# Patient Record
Sex: Female | Born: 1975 | Race: Black or African American | Hispanic: No | Smoking: Current every day smoker
Health system: Southern US, Community
[De-identification: ages and names within clinical notes are randomized; demographics above are authoritative.]

## PROBLEM LIST (undated history)

## (undated) DIAGNOSIS — F1410 Cocaine abuse, uncomplicated: Secondary | ICD-10-CM

## (undated) DIAGNOSIS — N739 Female pelvic inflammatory disease, unspecified: Secondary | ICD-10-CM

## (undated) DIAGNOSIS — C50919 Malignant neoplasm of unspecified site of unspecified female breast: Secondary | ICD-10-CM

## (undated) DIAGNOSIS — F319 Bipolar disorder, unspecified: Secondary | ICD-10-CM

## (undated) DIAGNOSIS — J45909 Unspecified asthma, uncomplicated: Secondary | ICD-10-CM

## (undated) DIAGNOSIS — F1910 Other psychoactive substance abuse, uncomplicated: Secondary | ICD-10-CM

## (undated) DIAGNOSIS — M25519 Pain in unspecified shoulder: Secondary | ICD-10-CM

## (undated) DIAGNOSIS — M25559 Pain in unspecified hip: Secondary | ICD-10-CM

## (undated) DIAGNOSIS — N200 Calculus of kidney: Secondary | ICD-10-CM

## (undated) DIAGNOSIS — I890 Lymphedema, not elsewhere classified: Secondary | ICD-10-CM

## (undated) DIAGNOSIS — F39 Unspecified mood [affective] disorder: Secondary | ICD-10-CM

---

## 2013-01-07 ENCOUNTER — Emergency Department: Payer: Self-pay | Admitting: Emergency Medicine

## 2013-01-09 ENCOUNTER — Emergency Department: Payer: Self-pay | Admitting: Emergency Medicine

## 2019-04-01 ENCOUNTER — Encounter: Payer: Self-pay | Admitting: Emergency Medicine

## 2019-04-01 ENCOUNTER — Other Ambulatory Visit: Payer: Self-pay

## 2019-04-01 ENCOUNTER — Emergency Department: Payer: Medicaid Other

## 2019-04-01 ENCOUNTER — Emergency Department
Admission: EM | Admit: 2019-04-01 | Discharge: 2019-04-02 | Disposition: A | Discharge status: Home / Self Care | Payer: Medicaid Other | Attending: Student | Admitting: Student

## 2019-04-01 DIAGNOSIS — F17200 Nicotine dependence, unspecified, uncomplicated: Secondary | ICD-10-CM | POA: Insufficient documentation

## 2019-04-01 DIAGNOSIS — R410 Disorientation, unspecified: Secondary | ICD-10-CM | POA: Insufficient documentation

## 2019-04-01 DIAGNOSIS — Z79899 Other long term (current) drug therapy: Secondary | ICD-10-CM | POA: Insufficient documentation

## 2019-04-01 DIAGNOSIS — F1910 Other psychoactive substance abuse, uncomplicated: Secondary | ICD-10-CM | POA: Insufficient documentation

## 2019-04-01 DIAGNOSIS — J45909 Unspecified asthma, uncomplicated: Secondary | ICD-10-CM | POA: Insufficient documentation

## 2019-04-01 HISTORY — DX: Malignant neoplasm of unspecified site of unspecified female breast: C50.919

## 2019-04-01 HISTORY — DX: Unspecified mood (affective) disorder: F39

## 2019-04-01 HISTORY — DX: Pain in unspecified hip: M25.559

## 2019-04-01 HISTORY — DX: Pain in unspecified shoulder: M25.519

## 2019-04-01 HISTORY — DX: Unspecified asthma, uncomplicated: J45.909

## 2019-04-01 HISTORY — DX: Female pelvic inflammatory disease, unspecified: N73.9

## 2019-04-01 HISTORY — DX: Lymphedema, not elsewhere classified: I89.0

## 2019-04-01 HISTORY — DX: Other psychoactive substance abuse, uncomplicated: F19.10

## 2019-04-01 HISTORY — DX: Cocaine abuse, uncomplicated: F14.10

## 2019-04-01 HISTORY — DX: Calculus of kidney: N20.0

## 2019-04-01 HISTORY — DX: Bipolar disorder, unspecified: F31.9

## 2019-04-01 LAB — COMPREHENSIVE METABOLIC PANEL
ALT: 11 U/L (ref 0–44)
AST: 22 U/L (ref 15–41)
Albumin: 4.3 g/dL (ref 3.5–5.0)
Alkaline Phosphatase: 47 U/L (ref 38–126)
Anion gap: 9 (ref 5–15)
BUN: 23 mg/dL — ABNORMAL HIGH (ref 6–20)
CO2: 23 mmol/L (ref 22–32)
Calcium: 8.7 mg/dL — ABNORMAL LOW (ref 8.9–10.3)
Chloride: 108 mmol/L (ref 98–111)
Creatinine, Ser: 0.57 mg/dL (ref 0.44–1.00)
GFR calc Af Amer: >60 mL/min (ref >60)
GFR calc non Af Amer: >60 mL/min (ref >60)
Glucose, Bld: 98 mg/dL (ref 70–99)
Potassium: 3.6 mmol/L (ref 3.5–5.1)
Sodium: 140 mmol/L (ref 135–145)
Total Bilirubin: 0.5 mg/dL (ref 0.3–1.2)
Total Protein: 7.6 g/dL (ref 6.5–8.1)

## 2019-04-01 LAB — URINALYSIS, COMPLETE (UACMP) WITH MICROSCOPIC
Bacteria, UA: NONE SEEN
Bilirubin Urine: NEGATIVE
Glucose, UA: NEGATIVE mg/dL
Hgb urine dipstick: NEGATIVE
Ketones, ur: NEGATIVE mg/dL
Leukocytes,Ua: NEGATIVE
Nitrite: NEGATIVE
Protein, ur: NEGATIVE mg/dL
Specific Gravity, Urine: 1.024 (ref 1.005–1.030)
pH: 6 (ref 5.0–8.0)

## 2019-04-01 LAB — CBC WITH DIFFERENTIAL/PLATELET
Abs Immature Granulocytes: 0.04 10*3/uL (ref 0.00–0.07)
Basophils Absolute: 0.1 10*3/uL (ref 0.0–0.1)
Basophils Relative: 1 %
Eosinophils Absolute: 0.2 10*3/uL (ref 0.0–0.5)
Eosinophils Relative: 2 %
HCT: 36.9 % (ref 36.0–46.0)
Hemoglobin: 12 g/dL (ref 12.0–15.0)
Immature Granulocytes: 0 %
Lymphocytes Relative: 18 %
Lymphs Abs: 2 10*3/uL (ref 0.7–4.0)
MCH: 32.6 pg (ref 26.0–34.0)
MCHC: 32.5 g/dL (ref 30.0–36.0)
MCV: 100.3 fL — ABNORMAL HIGH (ref 80.0–100.0)
Monocytes Absolute: 0.9 10*3/uL (ref 0.1–1.0)
Monocytes Relative: 8 %
Neutro Abs: 7.6 10*3/uL (ref 1.7–7.7)
Neutrophils Relative %: 71 %
Platelets: 239 10*3/uL (ref 150–400)
RBC: 3.68 MIL/uL — ABNORMAL LOW (ref 3.87–5.11)
RDW: 13.4 % (ref 11.5–15.5)
WBC: 10.8 10*3/uL — ABNORMAL HIGH (ref 4.0–10.5)
nRBC: 0 % (ref 0.0–0.2)

## 2019-04-01 LAB — BLOOD GAS, VENOUS
Acid-base deficit: 2.2 mmol/L — ABNORMAL HIGH (ref 0.0–2.0)
Bicarbonate: 23.7 mmol/L (ref 20.0–28.0)
O2 Saturation: 87.3 %
Patient temperature: 37
pCO2, Ven: 44 mmHg (ref 44.0–60.0)
pH, Ven: 7.34 (ref 7.250–7.430)
pO2, Ven: 57 mmHg — ABNORMAL HIGH (ref 32.0–45.0)

## 2019-04-01 LAB — URINE DRUG SCREEN, QUALITATIVE (ARMC ONLY)
Amphetamines, Ur Screen: NOT DETECTED
Barbiturates, Ur Screen: NOT DETECTED
Benzodiazepine, Ur Scrn: NOT DETECTED
Cannabinoid 50 Ng, Ur ~~LOC~~: POSITIVE — AB
Cocaine Metabolite,Ur ~~LOC~~: POSITIVE — AB
MDMA (Ecstasy)Ur Screen: NOT DETECTED
Methadone Scn, Ur: NOT DETECTED
Opiate, Ur Screen: NOT DETECTED
Phencyclidine (PCP) Ur S: NOT DETECTED
Tricyclic, Ur Screen: POSITIVE — AB

## 2019-04-01 LAB — VALPROIC ACID LEVEL: Valproic Acid Lvl: 71 ug/mL (ref 50.0–100.0)

## 2019-04-01 LAB — GLUCOSE, CAPILLARY: Glucose-Capillary: 92 mg/dL (ref 70–99)

## 2019-04-01 LAB — ETHANOL: Alcohol, Ethyl (B): <10 mg/dL (ref <10)

## 2019-04-01 LAB — AMMONIA: Ammonia: 23 umol/L (ref 9–35)

## 2019-04-01 LAB — POCT PREGNANCY, URINE: Preg Test, Ur: NEGATIVE

## 2019-04-01 MED ORDER — SODIUM CHLORIDE 0.9 % IV BOLUS
1000.0000 mL | Freq: Once | INTRAVENOUS | Status: AC
  Administered 2019-04-01: 1000 mL via INTRAVENOUS

## 2019-04-01 NOTE — ED Notes (Signed)
Pt returned from CT at this time.  

## 2019-04-01 NOTE — ED Triage Notes (Signed)
Pt presents to ED via [facility] with c/o AMS. Per EMS, [facility] and EMS called out due to patient [place] of [address] against the [place] rail and walking barefoot along the road. EMS reports patient lethargic, attempted to wake patient up with ammonia capsule PTA with minimum success. Per EMS pt denies ETOH and drug use but endorses she hasn't slept in 2 days. EMS reports that patient had bottles of Seroquel, cogentin and Depakote in the car. EMS reports that patient ambulatory however unsteady on her feet while walking.   Upon arrival [facility] at bedside. Pt noted to be lethargic, unable to stay awake without stimuli.    VSS PTA, CBG 106.

## 2019-04-01 NOTE — ED Provider Notes (Signed)
[facility] Emergency Department Provider Note  ____________________________________________   None    (approximate)  I have reviewed the triage vital signs and the nursing notes.   HISTORY  Chief Complaint Altered Mental Status    HPI Ana Williamson is a 44 y.o. female here with altered mental status.  Patient was reportedly found walking on the side of the road.  There were multiple calls for erratic driving prior to this.  She was found confused, drowsy, falling asleep.  She was brought to the ED.  She arrives with [employer].  On my assessment, patient is actively falling asleep midsentence, unable to provide much history.  She does recognize her name but is unable to provide additional history.  She denies any pain.  Remainder of history limited secondary to acute altered mental status.  Level 5 caveat invoked as remainder of history, ROS, and physical exam limited due to patient's AMS.         Past Medical History:  Diagnosis Date  . Asthma   . Bipolar 1 disorder ([place])   . Breast cancer ([place])   . Cocaine abuse ([place])   . Hip pain   . Lymphedema   . Mood disorder ([place])   . PID (pelvic inflammatory disease)   . Polysubstance abuse ([place])   . Renal calculi   . Shoulder pain    Unable to assess due to altered mental status.    There are no problems to display for this patient.    Prior to Admission medications   Medication Sig Start Date End Date Taking? Authorizing Provider  albuterol (VENTOLIN HFA) 108 (90 Base) MCG/ACT inhaler Inhale 1-2 puffs into the lungs every 6 (six) hours as needed for shortness of breath or wheezing.   Yes [provider]  benztropine (COGENTIN) 1 MG tablet Take 1 mg by mouth at bedtime as needed.   Yes [provider]  busPIRone (BUSPAR) 10 MG tablet Take 10 mg by mouth 2 (two) times daily.   Yes [provider]  ibuprofen (ADVIL) 200 MG tablet Take 400-600 mg by mouth  every 6 (six) hours as needed for pain or fever.   Yes [provider]  lamoTRIgine (LAMICTAL) 25 MG tablet Take 25 mg by mouth daily.   Yes [provider]  prazosin (MINIPRESS) 1 MG capsule Take 1 mg by mouth at bedtime.   Yes [provider]  QUEtiapine (SEROQUEL) 200 MG tablet Take 200 mg by mouth daily.   Yes [provider]  QUEtiapine (SEROQUEL) 400 MG tablet Take 400 mg by mouth at bedtime.   Yes [provider]  QUEtiapine (SEROQUEL) 50 MG tablet Take 50 mg by mouth See admin instructions. Take 1 tablet (50mg ) by mouth every morning and take 1 tablet (50mg ) by mouth at lunch   Yes [provider]    Allergies Codeine and Ethanol  No family history on file.  Social History Social History   Tobacco Use  . Smoking status: Current Every Day Smoker  . Smokeless tobacco: Never Used  Substance Use Topics  . Alcohol use: Not Currently    Comment: UTA  . Drug use: Yes    Comment: Hx of Polysubstance Abuse, unknown if current    Review of Systems  Review of Systems  Unable to perform ROS: Mental status change     ____________________________________________  PHYSICAL EXAM:      VITAL SIGNS: ED Triage Vitals  Enc Vitals Group  BP      Pulse      Resp      Temp      Temp src      SpO2      Weight      Height      Head Circumference      Peak Flow      Pain Score      Pain Loc      Pain Edu?      Excl. in [place]?      Physical Exam Vitals and nursing note reviewed.  Constitutional:      General: She is not in acute distress.    Appearance: She is well-developed.     Comments: Disheveled  HENT:     Head: Normocephalic and atraumatic.     Mouth/Throat:     Mouth: Mucous membranes are dry.  Eyes:     Conjunctiva/sclera: Conjunctivae normal.  Cardiovascular:     Rate and Rhythm: Normal rate and regular rhythm.     Heart sounds: Normal heart sounds.  Pulmonary:     Effort: Pulmonary effort is normal. No  respiratory distress.     Breath sounds: No wheezing.  Abdominal:     General: There is no distension.  Musculoskeletal:     Cervical back: Neck supple.  Skin:    General: Skin is warm.     Capillary Refill: Capillary refill takes less than 2 seconds.     Findings: No rash.  Neurological:     Motor: No abnormal muscle tone.     Comments: Drowsy, actively falling asleep, unable to provide history.  Appears to move all extremities.       ____________________________________________   LABS (all labs ordered are listed, but only abnormal results are displayed)  Labs Reviewed  CBC WITH DIFFERENTIAL/PLATELET - Abnormal; Notable for the following components:      Result Value   WBC 10.8 (*)    RBC 3.68 (*)    MCV 100.3 (*)    All other components within normal limits  COMPREHENSIVE METABOLIC PANEL - Abnormal; Notable for the following components:   BUN 23 (*)    Calcium 8.7 (*)    All other components within normal limits  ETHANOL - Abnormal; Notable for the following components:   Alcohol, Ethyl (B) 17 (*)    All other components within normal limits  URINE DRUG SCREEN, QUALITATIVE ([facility] ONLY) - Abnormal; Notable for the following components:   Tricyclic, Ur Screen POSITIVE (*)    Cocaine Metabolite,Ur [place] POSITIVE (*)    Cannabinoid 50 Ng, Ur [place] POSITIVE (*)    All other components within normal limits  URINALYSIS, COMPLETE (UACMP) WITH MICROSCOPIC - Abnormal; Notable for the following components:   Color, Urine YELLOW (*)    APPearance CLEAR (*)    All other components within normal limits  AMMONIA  VALPROIC ACID LEVEL  GLUCOSE, CAPILLARY  POC URINE PREG, ED  POCT PREGNANCY, URINE    ____________________________________________  EKG: Normal sinus rhythm, ventricular rate 85.  PR 118, QRS 94, QTc 480.  No acute ST elevations or depressions. ________________________________________  RADIOLOGY All imaging, including plain films, CT scans, and ultrasounds,  independently reviewed by me, and interpretations confirmed via formal radiology reads.  ED MD interpretation:   CT head: No acute abnormality  Official radiology report(s): CT Head Wo Contrast  Result Date: 04/01/2019 CLINICAL DATA:  Pt presents to ED via [facility] with c/o AMS. Per EMS, [facility] and EMS called  out due to patient [place] [address] against the [place] rail and walking barefoot along the road. EMS reports patient lethargic, attempted to wake pa.*comment was truncated*Encephalopathy EXAM: CT HEAD WITHOUT CONTRAST TECHNIQUE: Contiguous axial images were obtained from the base of the skull through the vertex without intravenous contrast. COMPARISON:  None. FINDINGS: Brain: No acute intracranial hemorrhage. No focal mass lesion. No CT evidence of acute infarction. No midline shift or mass effect. No hydrocephalus. Basilar cisterns are patent. Vascular: No hyperdense vessel or unexpected calcification. Skull: Normal. Negative for fracture or focal lesion. Sinuses/Orbits: Paranasal sinuses and mastoid air cells are clear. Orbits are clear. Other: None. IMPRESSION: Normal head CT. Electronically Signed   By: Suzy Bouchard M.D.   On: 04/01/2019 11:19    ____________________________________________  PROCEDURES   Procedure(s) performed (including Critical Care):  Procedures  ____________________________________________  INITIAL IMPRESSION / MDM / [place] / ED COURSE  As part of my medical decision making, I reviewed the following data within the [facility] notes reviewed and incorporated, Old chart reviewed, Notes from prior ED visits, and [place] Controlled Substance Database       *Ana Williamson was evaluated in Emergency Department on 04/01/2019 for the symptoms described in the history of present illness. She was evaluated in the context of the global COVID-19 pandemic, which necessitated consideration that the patient might be at risk  for infection with the SARS-CoV-2 virus that causes COVID-19. Institutional protocols and algorithms that pertain to the evaluation of patients at risk for COVID-19 are in a state of rapid change based on information released by regulatory bodies including the CDC and federal and state organizations. These policies and algorithms were followed during the patient's care in the ED.  Some ED evaluations and interventions may be delayed as a result of limited staffing during the pandemic.*     Medical Decision Making: 44 year old female here with altered mental status.  She was reportedly found wandering on the side of the road.  On arrival here, she was noted to have tire cleaner in her pants, which does contain GHB. No signs of trauma. CT Head neg. Labs largely reassuring, without acute abnormality to explain AMS. UDS positive for cocaine, THC, and TCAs. EKG w/o QRS prolongation or signs of TCA toxicity.  Will monitor for sobriety, then reassess. Symptoms c/w sedative overdose - unclear intent.  ____________________________________________  FINAL CLINICAL IMPRESSION(S) / ED DIAGNOSES  Final diagnoses:  Disorientation  Polysubstance abuse ([place])     MEDICATIONS GIVEN DURING THIS VISIT:  Medications  sodium chloride 0.9 % bolus 1,000 mL (0 mLs Intravenous Stopped 04/01/19 1250)     ED Discharge Orders    None       Note:  This document was prepared using Dragon voice recognition software and may include unintentional dictation errors.   Duffy Bruce, MD 04/01/19 [phone]

## 2019-04-01 NOTE — ED Notes (Signed)
State [employer] at bedside at this time with warrant for forensic blood draw. This RN obtained blood from patient's R hand using butterfly and iodine skin prep, pt's skin noted to be dry at time of puncture. Pt noted to be more alert after blood draw, attempting to get out of bed, VSS at this time.

## 2019-04-01 NOTE — ED Notes (Signed)
Pt brief checked by this RN and Engineer, site, RN. Pt brief noted to be clean and dry at this time.

## 2019-04-01 NOTE — ED Notes (Signed)
This RN to bedside due to patient visualized sitting on her knees, this RN attempted to redirect patient due to her buttocks being visible to other patients and staff. Pt noted to have had episode of urinary incontinence when in bed. This RN and [place] Ar, RN pulled patient on stretcher into empty room to change patient and stretcher. Pt repeatedly attempting to get out of bed, when staff attempted to redirect patient, pt became combative and attempting to hit staff. Pt noted to stand at end of bed and fall asleep when standing. Sheets changed by this RN and Bri, RN. Pt's gown changed by this RN. Pt back to bed and placed back on monitor. Pt violence risk score re-assessed by this RN.

## 2019-04-01 NOTE — Discharge Instructions (Addendum)
You have been seen in the Emergency Department (ED) today for confusion and concern for substance use. If you continue using illegal substances, you will eventually end up dead.  If you have any outpatient physician or therapist, please follow up with them, as they may help provide additional resources.    Please return to the ED immediately if you have ANY thoughts of hurting yourself or anyone else, so that we may help you.  Follow up with your doctor and/or therapist as soon as possible regarding today's ED visit.     Please contact [facility] for additional mental health/psychiatric assistance:  [address] [place], [place] [address] Phone:  [phone] or [phone]  Open Access:   Walk-in ASSESSMENT hours, M-W-F, 8:00am - 3:00pm Advanced Acess CRISIS:  M-F, 8:00am - 8:00pm Outpatient Services Office Hours:  M-F, 8:00am - 5:00pm

## 2019-04-01 NOTE — ED Notes (Signed)
Pt frantically requesting to call mother. Pt provided with phone and attempted to calm pt

## 2019-04-01 NOTE — ED Notes (Signed)
In and out cath performed by this RN and Engineer, site, RN. Pt moved into room 6 temporarily for patient privacy. Pt changed into gown by this RN and Engineer, site, RN. Pt's underwear, jeans, and pink longjohns placed into belongings bag, pt noted to have a bottle of tirecleaner in her jeans, also placed into belongings bag, pt's brown jacket and purse placed into belongings bag. This Rn and Engineer, site, RN verified belongings in bag and placed behind patient on stretcher.

## 2019-04-01 NOTE — ED Provider Notes (Signed)
Patient presented with AMS in setting of polysubstance abuse.  Work-up, aside from positive UDS, largely unremarkable, including normal CT head, normal electrolytes, negative pregnancy.  Plan to monitor for sobriety and then reassess.  7:15 PM Patient awake and alert and re-evaluated. She adamantly denies any drug use, though UDS is positive for tricyclics, cannabis, cocaine.  She denies any SI, any ingestion with the intent of self-harm, and in fact adamantly continues to deny any ingestion at all.  She has tolerated PO in the ED and returned to baseline.  As such she is stable for discharge with outpatient follow up. Resource information provided.     Lilia Pro., MD 04/01/19 [phone]

## 2019-04-02 NOTE — ED Notes (Signed)
Upon this RN arrival to bedside pt initially resting in bed, pt awakens easily to EDP arrival at bedside. Pt however noted to be back asleep at this time. Respirations even and unlabored at this time. Pt continues to await ride to arrive to pick patient up.

## 2019-04-02 NOTE — ED Notes (Signed)
Pt awake at this time. Requesting a snack tray at this time. Snack tray and ginger ale provided to pt. Pt with no other needs at this time.

## 2019-04-02 NOTE — ED Notes (Signed)
Pt awake at this time, asking if someone has heard from her mother. This RN informed pt, we have not heard from her mother, that she is probably sleep at this time. Pt with no other needs.

## 2019-04-02 NOTE — ED Notes (Signed)
Pt resting at this time, NAD with equal chest rise and fall.

## 2019-04-02 NOTE — ED Notes (Signed)
Pt given paper scrubs to change into. This RN reviewed D/C instructions with patient at this time. Pt c/o 7/10 CP, EDP made aware, states okay for D/C. EDP states okay for D/C to lobby to await her ride. Pt given meal tray at this time. Pt repeatedly states that she needs inpatient because she is "about to detox from a lot of psych drugs", this RN once again pointed out referral to [facility]. Per EDP pt to be discharged to lobby. Pt given phone numbers she used per night shift to speak with her mother. Charge RN aware, EDP aware. Pt visualized in NAD at time of D/C. Pt belongings placed in bag by this RN yesterday given to patient at this time. Pt taken to lobby via wheelchair at this time with breakfast tray and all patient belongings.

## 2019-04-02 NOTE — ED Notes (Signed)
Pt A&O x 4, pt currently on the phone with family members at this time. No other needs at this time.

## 2020-03-10 DEATH — deceased

## 2021-06-23 IMAGING — CT CT HEAD W/O CM
3 series · 16 of 45 positions shown, 19 images · non-contrast
Comparison: None.

CLINICAL DATA: Pt presents to ED via [facility] with c/o AMS. Per EMS,
[facility] and EMS called out due to patient pulling over on side of [REDACTED]
against the [place] and walking [REDACTED] along the road. EMS
reports patient lethargic, attempted to Eduardo Ariel pa.*comment was
truncated*Encephalopathy

EXAM:
CT HEAD WITHOUT CONTRAST
TECHNIQUE: Contiguous axial images were obtained from the base of the skull
through the vertex without intravenous contrast.

[Series 4: head wo · axial · 0.37mm/px · z∈[+343,+458]mm · 10 of 28 slices shown, 13 images]
[im 3/28  brain]
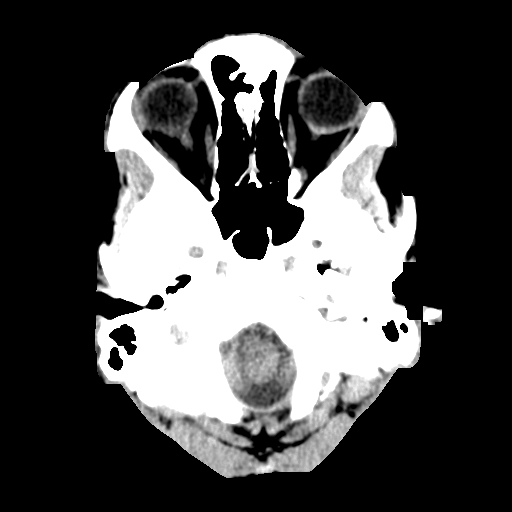
[im 3/28  bone]
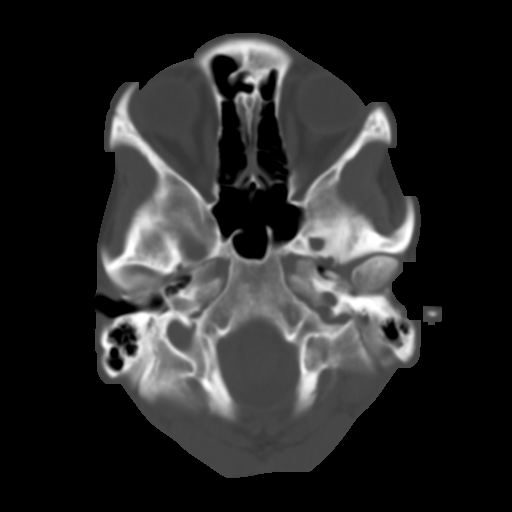
[im 5/28  brain]
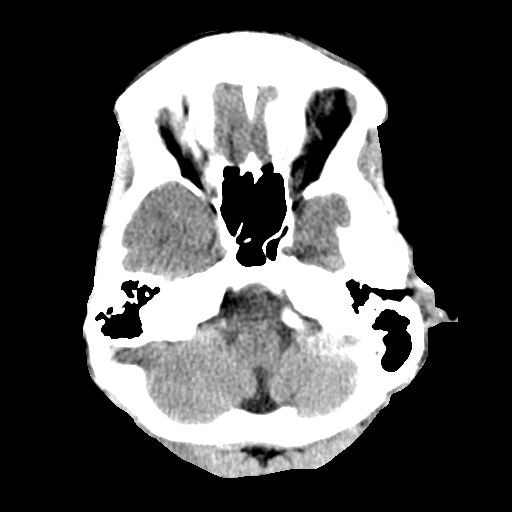
[im 8/28  brain]
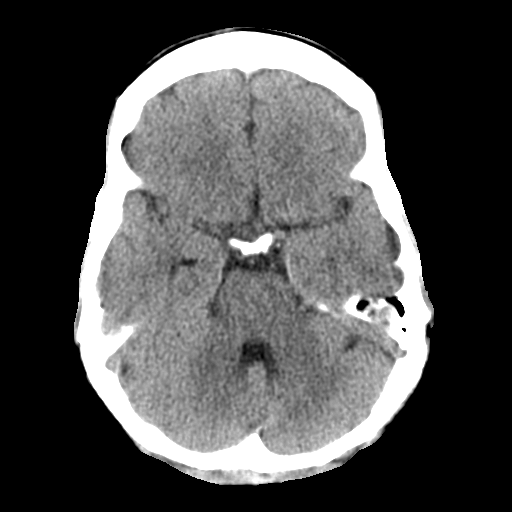
[im 11/28  brain]
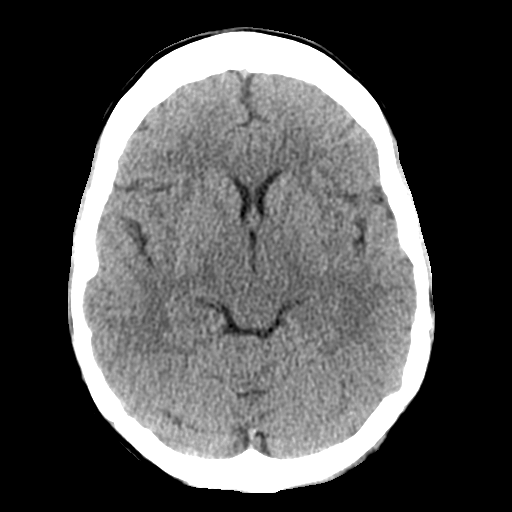
[im 13/28  brain]
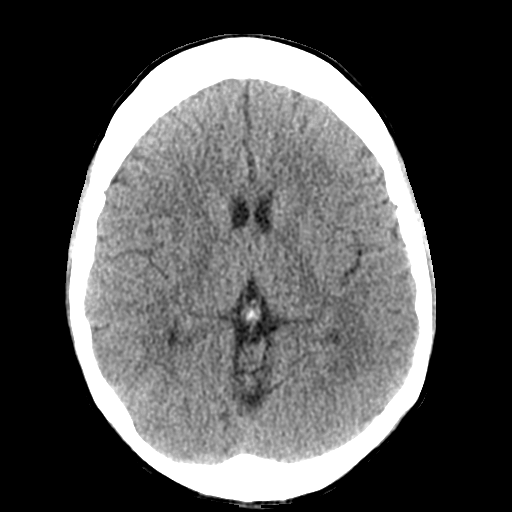
[im 13/28  bone]
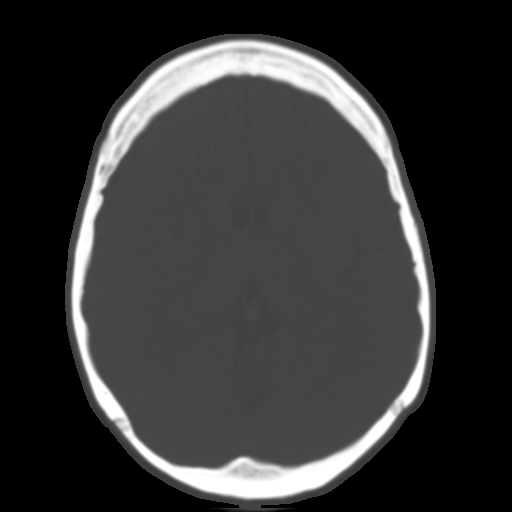
[im 16/28  brain]
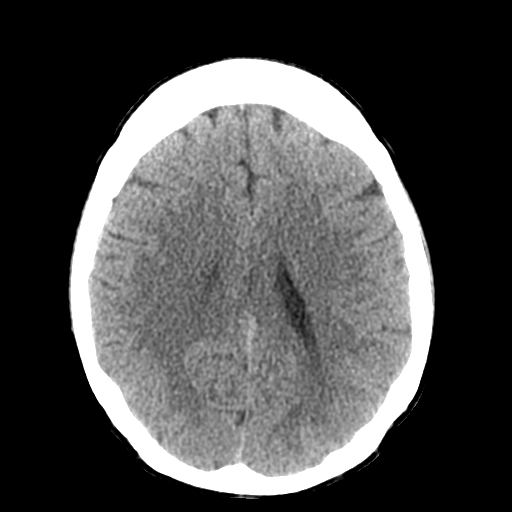
[im 18/28  brain]
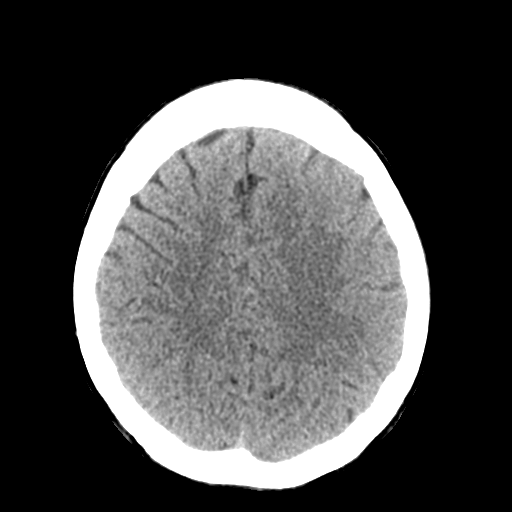
[im 21/28  brain]
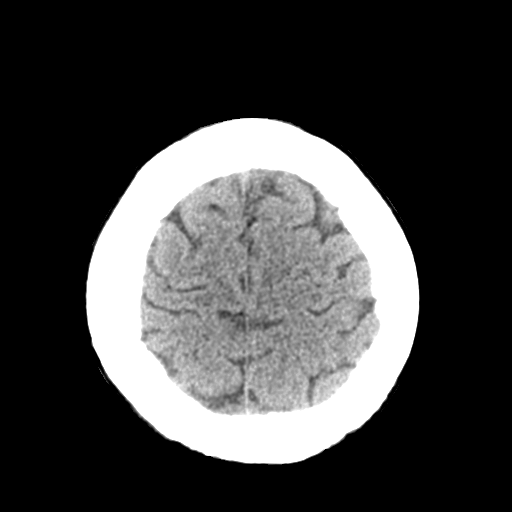
[im 24/28  brain]
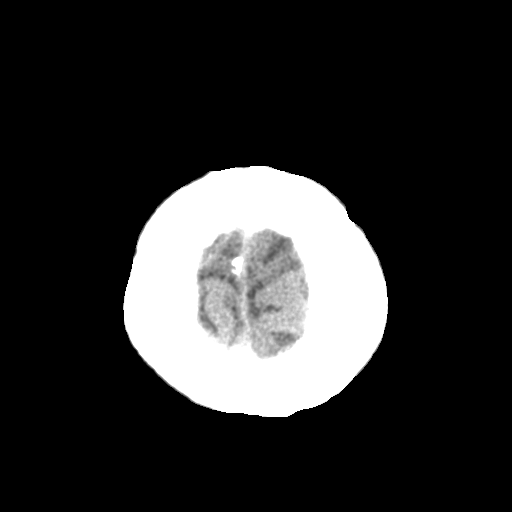
[im 24/28  bone]
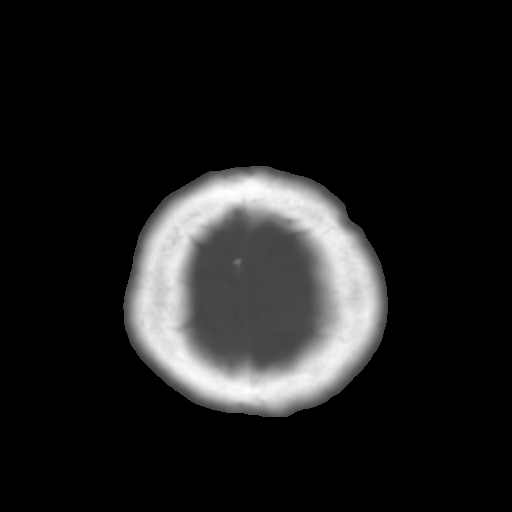
[im 26/28  brain]
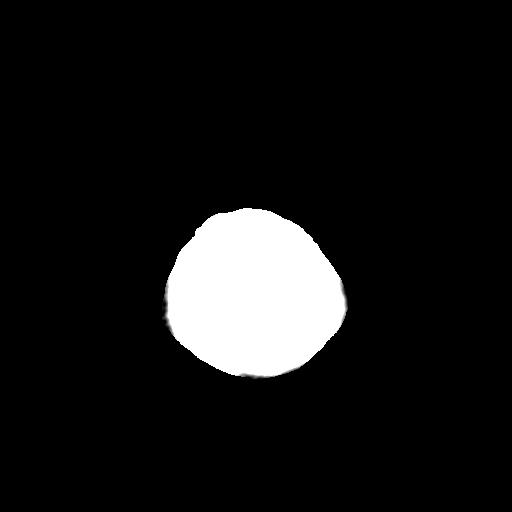

[Series 5: coronal soft tissue · coronal · 0.29mm/px · 3 of 62 slices shown]
[im 21/62  brain]
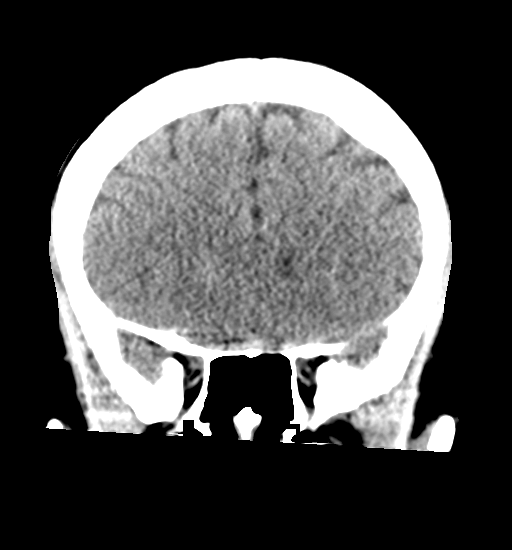
[im 28/62  brain]
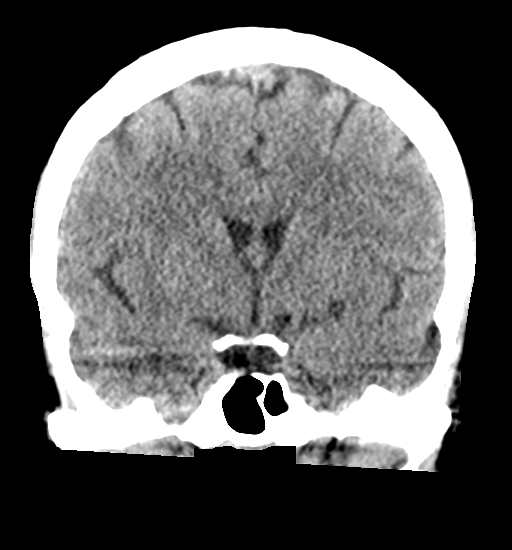
[im 34/62  brain]
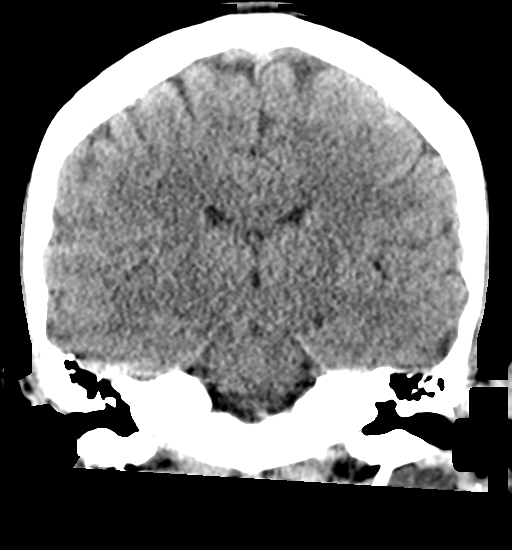

[Series 6: sagittal soft tissue · sagittal · 0.31mm/px · 3 of 49 slices shown]
[im 17/49  brain]
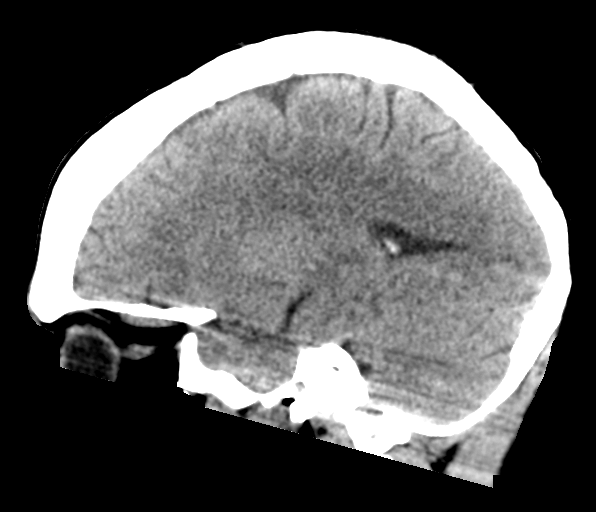
[im 25/49  brain]
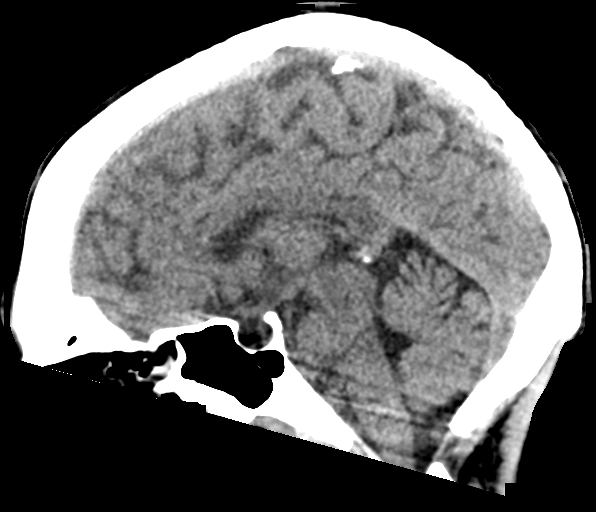
[im 33/49  brain]
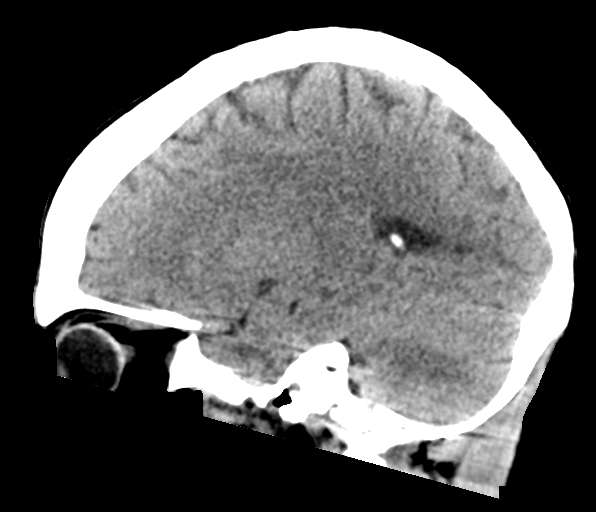

[16 of 45 positions shown; findings below may reference images not displayed]

FINDINGS: Brain: No acute intracranial hemorrhage. No focal mass lesion. No CT
evidence of acute infarction. No midline shift or mass effect. No
hydrocephalus. Basilar cisterns are patent.

Vascular: No hyperdense vessel or unexpected calcification.

Skull: Normal. Negative for fracture or focal lesion.

Sinuses/Orbits: Paranasal sinuses and mastoid air cells are clear.
Orbits are clear.

Other: None.
IMPRESSION: Normal head CT.
